# Patient Record
Sex: Male | Born: 1985 | Race: White | Hispanic: No | Smoking: Never smoker
Health system: Southern US, Community
[De-identification: ages and names within clinical notes are randomized; demographics above are authoritative.]

---

## 2006-08-26 ENCOUNTER — Ambulatory Visit: Payer: Self-pay | Admitting: Family Medicine

## 2006-08-26 DIAGNOSIS — R002 Palpitations: Secondary | ICD-10-CM | POA: Insufficient documentation

## 2006-08-26 DIAGNOSIS — R1012 Left upper quadrant pain: Secondary | ICD-10-CM

## 2006-11-16 ENCOUNTER — Emergency Department (HOSPITAL_COMMUNITY): Admission: EM | Admit: 2006-11-16 | Discharge: 2006-11-16 | Payer: Self-pay | Admitting: Emergency Medicine

## 2007-05-07 ENCOUNTER — Emergency Department (HOSPITAL_COMMUNITY): Admission: EM | Admit: 2007-05-07 | Discharge: 2007-05-08 | Payer: Self-pay | Admitting: Emergency Medicine

## 2010-10-13 LAB — D-DIMER, QUANTITATIVE: D-Dimer, Quant: 0.25

## 2010-10-13 LAB — BASIC METABOLIC PANEL
CO2: 26
Calcium: 9.7
Chloride: 108
Creatinine, Ser: 0.82
Glucose, Bld: 109 — ABNORMAL HIGH

## 2010-10-13 LAB — CBC
MCHC: 33.3
MCV: 92.1
RDW: 12.4

## 2010-10-28 LAB — DIFFERENTIAL
Basophils Absolute: 0
Eosinophils Relative: 0
Lymphocytes Relative: 9 — ABNORMAL LOW
Monocytes Absolute: 0.4
Monocytes Relative: 4
Neutro Abs: 8.4 — ABNORMAL HIGH

## 2010-10-28 LAB — BASIC METABOLIC PANEL
CO2: 25
Calcium: 9.6
GFR calc Af Amer: >60
GFR calc non Af Amer: >60
Glucose, Bld: 149 — ABNORMAL HIGH
Potassium: 3.5
Sodium: 142

## 2010-10-28 LAB — RAPID URINE DRUG SCREEN, HOSP PERFORMED
Amphetamines: NOT DETECTED
Barbiturates: NOT DETECTED
Benzodiazepines: NOT DETECTED

## 2010-10-28 LAB — CBC
HCT: 45.8
Hemoglobin: 16
RBC: 5.09
RDW: 12.8

## 2017-04-18 ENCOUNTER — Other Ambulatory Visit: Payer: Self-pay | Admitting: Family Medicine

## 2017-04-18 ENCOUNTER — Ambulatory Visit
Admission: RE | Admit: 2017-04-18 | Discharge: 2017-04-18 | Disposition: A | Discharge status: Home / Self Care | Payer: BC Managed Care – PPO | Source: Ambulatory Visit | Attending: Family Medicine | Admitting: Family Medicine

## 2017-04-18 DIAGNOSIS — K5900 Constipation, unspecified: Secondary | ICD-10-CM

## 2017-07-18 ENCOUNTER — Other Ambulatory Visit: Payer: Self-pay | Admitting: Gastroenterology

## 2017-07-18 DIAGNOSIS — R634 Abnormal weight loss: Secondary | ICD-10-CM

## 2017-07-18 DIAGNOSIS — R1031 Right lower quadrant pain: Secondary | ICD-10-CM

## 2017-07-18 DIAGNOSIS — R194 Change in bowel habit: Secondary | ICD-10-CM

## 2017-07-27 ENCOUNTER — Ambulatory Visit
Admission: RE | Admit: 2017-07-27 | Discharge: 2017-07-27 | Disposition: A | Discharge status: Home / Self Care | Payer: BC Managed Care – PPO | Source: Ambulatory Visit | Attending: Gastroenterology | Admitting: Gastroenterology

## 2017-07-27 DIAGNOSIS — R1031 Right lower quadrant pain: Secondary | ICD-10-CM

## 2017-07-27 DIAGNOSIS — R194 Change in bowel habit: Secondary | ICD-10-CM

## 2017-07-27 DIAGNOSIS — R634 Abnormal weight loss: Secondary | ICD-10-CM

## 2017-07-27 MED ORDER — IOPAMIDOL (ISOVUE-300) INJECTION 61%
100.0000 mL | Freq: Once | INTRAVENOUS | Status: AC | PRN
  Administered 2017-07-27: 100 mL via INTRAVENOUS

## 2017-08-07 ENCOUNTER — Emergency Department
Admission: EM | Admit: 2017-08-07 | Discharge: 2017-08-07 | Disposition: A | Discharge status: Home / Self Care | Payer: BC Managed Care – PPO | Attending: Emergency Medicine | Admitting: Emergency Medicine

## 2017-08-07 ENCOUNTER — Other Ambulatory Visit: Payer: Self-pay

## 2017-08-07 DIAGNOSIS — R1084 Generalized abdominal pain: Secondary | ICD-10-CM | POA: Diagnosis present

## 2017-08-07 DIAGNOSIS — R109 Unspecified abdominal pain: Secondary | ICD-10-CM

## 2017-08-07 LAB — LIPASE, BLOOD: Lipase: 29 U/L (ref 11–51)

## 2017-08-07 LAB — COMPREHENSIVE METABOLIC PANEL
ALBUMIN: 4.7 g/dL (ref 3.5–5.0)
ALT: 14 U/L (ref 0–44)
AST: 23 U/L (ref 15–41)
Alkaline Phosphatase: 47 U/L (ref 38–126)
Anion gap: 11 (ref 5–15)
BUN: 6 mg/dL (ref 6–20)
CALCIUM: 9.6 mg/dL (ref 8.9–10.3)
CHLORIDE: 108 mmol/L (ref 98–111)
CO2: 24 mmol/L (ref 22–32)
CREATININE: 1.2 mg/dL (ref 0.61–1.24)
GFR calc Af Amer: >60 mL/min (ref >60)
GFR calc non Af Amer: >60 mL/min (ref >60)
GLUCOSE: 125 mg/dL — AB (ref 70–99)
Potassium: 3.2 mmol/L — ABNORMAL LOW (ref 3.5–5.1)
SODIUM: 143 mmol/L (ref 135–145)
Total Bilirubin: 1.6 mg/dL — ABNORMAL HIGH (ref 0.3–1.2)
Total Protein: 7.5 g/dL (ref 6.5–8.1)

## 2017-08-07 LAB — CBC
HCT: 46.7 % (ref 40.0–52.0)
Hemoglobin: 16.3 g/dL (ref 13.0–18.0)
MCH: 31.5 pg (ref 26.0–34.0)
MCHC: 34.9 g/dL (ref 32.0–36.0)
MCV: 90.1 fL (ref 80.0–100.0)
Platelets: 200 10*3/uL (ref 150–440)
RBC: 5.19 MIL/uL (ref 4.40–5.90)
RDW: 13.3 % (ref 11.5–14.5)
WBC: 6.7 10*3/uL (ref 3.8–10.6)

## 2017-08-07 LAB — URINALYSIS, COMPLETE (UACMP) WITH MICROSCOPIC
BACTERIA UA: NONE SEEN
BILIRUBIN URINE: NEGATIVE
GLUCOSE, UA: NEGATIVE mg/dL
Hgb urine dipstick: NEGATIVE
KETONES UR: 5 mg/dL — AB
Leukocytes, UA: NEGATIVE
Nitrite: NEGATIVE
PH: 5 (ref 5.0–8.0)
PROTEIN: NEGATIVE mg/dL
Specific Gravity, Urine: 1.02 (ref 1.005–1.030)

## 2017-08-07 NOTE — ED Triage Notes (Signed)
Pt to ER via POV c/o fever of 13F last night and low temperature this AM of 75F. Pt reports recent hx of RLQ and tenderness to palpation. Pt alert and oriented X4, active, cooperative, pt in NAD. RR even and unlabored, color WNL.

## 2017-08-07 NOTE — ED Provider Notes (Addendum)
[facility] Emergency Department Provider Note ____________________________________________   First MD Initiated Contact with Patient 08/07/17 1150     (approximate)  I have reviewed the triage vital signs and the nursing notes.   HISTORY  Chief Complaint Fever and Abdominal Pain  HPI Dustin Curry is a 32 y.o. male with a history of palpitations and panic attack who is presenting to the emergency department 2 months of right-sided abdominal pain.  He states that the pain now feels like a sensitivity to the right side of his abdomen.  He has been eating and drinking normally.  Says that he has been having normal formed stools.  However, he did notice what could be a warm in his stool yesterday.  He should be a picture on his phone and a proximally 1 inch, clear and ribbonlike object that he noticed while wiping yesterday.  However, he states that he did not move.  He has not had any out of country travel.  No recent antibiotics.  Says that he has had an ultrasound of his right lower quadrant without any acute finding.  Then went to gastroenterology and was evaluated with a CAT scan which showed thickening of the appendix but what sounds like without inflammatory changes.  History reviewed. No pertinent past medical history.  Patient Active Problem List   Diagnosis Date Noted  . PALPITATIONS 08/26/2006  . LUQ PAIN 08/26/2006    History reviewed. No pertinent surgical history.  Prior to Admission medications   Not on File    Allergies Penicillins  No family history on file.  Social History Social History   Tobacco Use  . Smoking status: Never Smoker  Substance Use Topics  . Alcohol use: Not Currently  . Drug use: Not on file    Review of Systems  Constitutional: No fever/chills Eyes: No visual changes. ENT: No sore throat. Cardiovascular: Denies chest pain. Respiratory: Denies shortness of breath. Gastrointestinal: no nausea, no vomiting.   No diarrhea.  No constipation. Genitourinary: Negative for dysuria. Musculoskeletal: Negative for back pain. Skin: Negative for rash. Neurological: Negative for headaches, focal weakness or numbness.   ____________________________________________   PHYSICAL EXAM:  VITAL SIGNS: ED Triage Vitals  Enc Vitals Group     BP 08/07/17 1128 140/86     Pulse Rate 08/07/17 1128 (!) 134     Resp 08/07/17 1128 16     Temp 08/07/17 1128 98.3 F (36.8 C)     Temp Source 08/07/17 1128 Oral     SpO2 08/07/17 1128 97 %     Weight 08/07/17 1129 122 lb (55.3 kg)     Height 08/07/17 1129 5\' 5"  (1.651 m)     Head Circumference --      Peak Flow --      Pain Score 08/07/17 1129 3     Pain Loc --      Pain Edu? --      Excl. in GC? --     Constitutional: Alert and oriented. Well appearing and in no acute distress. Eyes: Conjunctivae are normal.  Head: Atraumatic. Nose: No congestion/rhinnorhea. Mouth/Throat: Mucous membranes are moist.  Neck: No stridor.   Cardiovascular: Normal rate, regular rhythm. Grossly normal heart sounds.   Heart rate of 77 while I am in the room. Respiratory: Normal respiratory effort.  No retractions. Lungs CTAB. Gastrointestinal: Soft and nontender. No distention. No CVA tenderness. Musculoskeletal: No lower extremity tenderness nor edema.  No joint effusions. Neurologic:  Normal speech and language.  No gross focal neurologic deficits are appreciated. Skin:  Skin is warm, dry and intact. No rash noted. Psychiatric: Mood and affect are normal. Speech and behavior are normal.  ____________________________________________   LABS (all labs ordered are listed, but only abnormal results are displayed)  Labs Reviewed  COMPREHENSIVE METABOLIC PANEL - Abnormal; Notable for the following components:      Result Value   Potassium 3.2 (*)    Glucose, Bld 125 (*)    Total Bilirubin 1.6 (*)    All other components within normal limits  URINALYSIS, COMPLETE (UACMP) WITH  MICROSCOPIC - Abnormal; Notable for the following components:   Color, Urine AMBER (*)    APPearance HAZY (*)    Ketones, ur 5 (*)    All other components within normal limits  LIPASE, BLOOD  CBC   ____________________________________________  EKG  ED ECG REPORT I, Arelia Longestavid M Schaevitz, the attending physician, personally viewed and interpreted this ECG.   Date: 08/07/2017  EKG Time: 1131  Rate: 136  Rhythm: sinus tachycardia  Axis: Normal  Intervals:Incomplete right bundle branch block  ST&T Change: No ST segment elevation or depression.  No abnormal T wave inversion.  ____________________________________________  RADIOLOGY   ____________________________________________   PROCEDURES  Procedure(s) performed:   Procedures  Critical Care performed:   ____________________________________________   INITIAL IMPRESSION / ASSESSMENT AND PLAN / ED COURSE  Pertinent labs & imaging results that were available during my care of the patient were reviewed by me and considered in my medical decision making (see chart for details).  Differential diagnosis includes, but is not limited to, acute appendicitis, renal colic, testicular torsion, urinary tract infection/pyelonephritis, prostatitis,  epididymitis, diverticulitis, small bowel obstruction or ileus, colitis, abdominal aortic aneurysm, gastroenteritis, hernia, etc. As part of my medical decision making, I reviewed the following data within the electronic MEDICAL RECORD NUMBER Old chart reviewed and outpt ultrasound  ----------------------------------------- 1:46 PM on 08/07/2017 -----------------------------------------  Patient at this time continues with heart rate in the 70s and 80s.  Very reassuring lab work.  No further complaints of any worsening pain.  Patient will continue to observe his stool for any evidence of further strange appearing object such as the possible worm.  He does not have any high risk features to have  intestinal parasites.  He has a reassuring exam as well as lab work.  We will continue to observe.  He says that he has an appointment with a surgeon on the 29th.  We discussed the results as well as the treatment plan and he is understanding and willing to comply. ____________________________________________   FINAL CLINICAL IMPRESSION(S) / ED DIAGNOSES  Abdominal pain.  NEW MEDICATIONS STARTED DURING THIS VISIT:  New Prescriptions   No medications on file     Note:  This document was prepared using Dragon voice recognition software and may include unintentional dictation errors.     Myrna BlazerSchaevitz, David Matthew, MD 08/07/17 1347    Pershing ProudSchaevitz, Myra Rudeavid Matthew, MD 08/07/17 1350

## 2017-08-07 NOTE — ED Notes (Addendum)
Pt reports that 2 months ago he went to PCP for pain in right upper abd - he reports that "nothing" was done and he returned to PCP with same pain 1 month later - they obtained a CT scan and told him that his appendix was "thickened" but that they were not concerned - he states they also found "a 4mm lesion" on his liver - he feels like today that he has 3 masses that he can feel "through the skin" on his right upper abd that is causing him pain - he reports "fever of 99.0" last night - he also reports hx of anxiety that causes his heart rate to be elevated

## 2018-02-15 ENCOUNTER — Other Ambulatory Visit: Payer: Self-pay | Admitting: Physician Assistant

## 2018-02-15 DIAGNOSIS — R1011 Right upper quadrant pain: Secondary | ICD-10-CM

## 2018-02-15 DIAGNOSIS — R1012 Left upper quadrant pain: Secondary | ICD-10-CM

## 2018-02-15 DIAGNOSIS — R112 Nausea with vomiting, unspecified: Secondary | ICD-10-CM

## 2018-02-17 ENCOUNTER — Ambulatory Visit
Admission: RE | Admit: 2018-02-17 | Discharge: 2018-02-17 | Disposition: A | Discharge status: Home / Self Care | Payer: BC Managed Care – PPO | Source: Ambulatory Visit | Attending: Physician Assistant | Admitting: Physician Assistant

## 2018-02-17 DIAGNOSIS — R1011 Right upper quadrant pain: Secondary | ICD-10-CM

## 2018-02-17 DIAGNOSIS — R112 Nausea with vomiting, unspecified: Secondary | ICD-10-CM

## 2018-02-17 DIAGNOSIS — R1012 Left upper quadrant pain: Secondary | ICD-10-CM

## 2018-07-18 ENCOUNTER — Other Ambulatory Visit: Payer: Self-pay

## 2018-07-18 ENCOUNTER — Ambulatory Visit: Payer: BC Managed Care – PPO | Admitting: Sports Medicine

## 2018-07-18 ENCOUNTER — Ambulatory Visit: Payer: Self-pay

## 2018-07-18 ENCOUNTER — Encounter: Payer: Self-pay | Admitting: Sports Medicine

## 2018-07-18 VITALS — BP 96/58 | Ht 66.0 in | Wt 123.0 lb

## 2018-07-18 DIAGNOSIS — M25562 Pain in left knee: Secondary | ICD-10-CM

## 2018-07-18 DIAGNOSIS — M222X2 Patellofemoral disorders, left knee: Secondary | ICD-10-CM | POA: Diagnosis not present

## 2018-07-18 NOTE — Assessment & Plan Note (Signed)
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  He also has VMO weakness on the left compared to the right side and compared to the vastus lateralis on the left side.  He was given VMO strengthening exercises including straight leg raises with squeezing an object between his knees to activate the VMO, straight leg raises with the toe pointed outward, wall squats using the left leg, and squats on the left leg.  We will follow-up with him in about 6 weeks to check his improvement.

## 2018-07-18 NOTE — Progress Notes (Signed)
Subjective:    Dustin Curry - 33 y.o. male MRN [id number]  Date of birth: 02-05-1985  CC:  Dustin Curry is here for chronic left knee pain.  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  He has continued to stay active with hiking and has done some running, but his left knee pain has prohibited him from running much at all times and hiking downhill is very painful for him.  He also says that climbing stairs exacerbates his pain.  Does not know of any alleviating factors.  ROS  He has never had any swelling, popping, clicking, or locking of the knee and denies numbness or tingling distal to the knee.  Health Maintenance:  Health Maintenance Due  Topic Date Due  . HIV Screening  02/01/1999  . TETANUS/TDAP  02/01/2003    -  reports that he has never smoked. He does not have any smokeless tobacco history on file. - Review of Systems: Per HPI. - Past Medical History: Patient Active Problem List   Diagnosis Date Noted  . Patellofemoral disorder of left knee 07/18/2018  . PALPITATIONS 08/26/2006  . LUQ PAIN 08/26/2006   - Medications: reviewed and updated   Objective:   Physical Exam BP (!) 96/58   Ht 5\' 6"  (1.676 m)   Wt 123 lb (55.8 kg)   BMI 19.85 kg/m  Gen: NAD, alert, cooperative with exam, well-appearing Knee, left: Inspection was negative for erythema, negative for effusion, and negative for obvious bony abnormalities. Palpation was negative for obvious Baker's cyst development, negative for asymmetric warmth, negative for joint line tenderness, positive for lateral condyle tenderness, negative for patellar tenderness, positive for patellar crepitus, and negative for tenderness of the pes anserine bursa. Patellar and quadriceps tendons were unremarkable. ROM normal in flexion (135 degrees) and extension (0 degrees) and lower leg rotation. Normal hamstring and quadriceps strength.  VMO  appears atrophied on medial side of the knee compared to the right.  There appears to be some laxity of the MCL.  Neurovascularly intact.  - Additional tests performed:    - Anterior Drawer neg   - Posterior Drawer neg   - L leg squat reproduces pain  - Meniscus:   - Thessaly: negative   - McMurray's: negative   Knee, right: Inspection was negative for erythema, negative for effusion, and negative for obvious bony abnormalities. Palpation was negative for obvious Baker's cyst development, negative for asymmetric warmth, negative for joint line tenderness, negative for condyle tenderness, negative for patellar tenderness, negative for patellar crepitus, and negative for tenderness of the pes anserine bursa. Patellar and quadriceps tendons were unremarkable. ROM normal in flexion (135 degrees) and extension (0 degrees) and lower leg rotation. Normal hamstring and quadriceps strength. Neurovascularly intact.  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  He also has VMO weakness on the left compared to the right side and compared to the vastus lateralis on the left side.  He was given VMO strengthening exercises including straight leg raises with squeezing an object between his knees  to activate the VMO, straight leg raises with the toe pointed outward, wall squats using the left leg, and squats on the left leg.  We will follow-up with him in about 6 weeks to check his improvement.    Lezlie OctaveAmanda Avyn Coate, M.D. 07/18/2018, 11:35 AM PGY-2, [facility]  I observed and examined the patient with the resident and agree with assessment and plan.  Note reviewed and modified by me. Sterling BigKB Fields,  MD

## 2018-11-11 ENCOUNTER — Encounter (INDEPENDENT_AMBULATORY_CARE_PROVIDER_SITE_OTHER): Payer: Self-pay

## 2018-11-17 IMAGING — CT CT ABD-PELV W/ CM
1 of 2 series · 14 of 32 positions shown, 19 images · IV contrast (APPLIED)
Comparison: None.

CLINICAL DATA: Right lower quadrant pain and weight loss of 8
pounds in the past 3-4 weeks. Change in bowel habits.

EXAM:
CT ABDOMEN AND PELVIS WITH CONTRAST
TECHNIQUE: Multidetector CT imaging of the abdomen and pelvis was performed
using the standard protocol following bolus administration of
intravenous contrast.
CONTRAST:  100mL 3TML6B-FAA IOPAMIDOL (3TML6B-FAA) INJECTION 61%

[Series 2: abd/pelvis w/cm · axial · 0.65mm/px · z∈[-467,-117]mm · 14 of 80 slices shown, 19 images]
[im 5/80  soft-tissue]
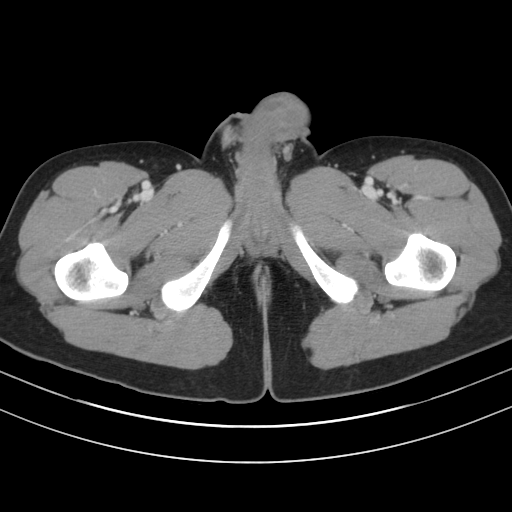
[im 5/80  bone]
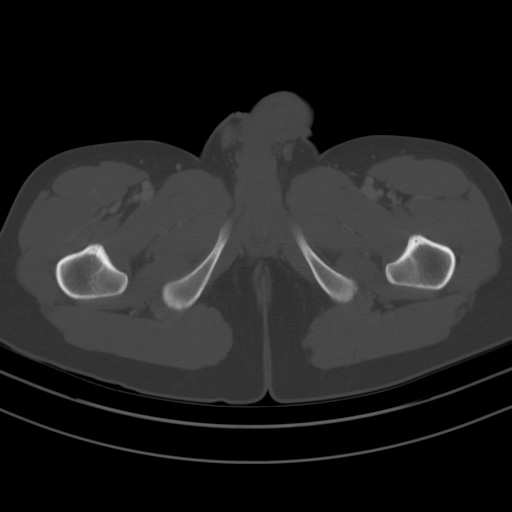
[im 13/80  soft-tissue]
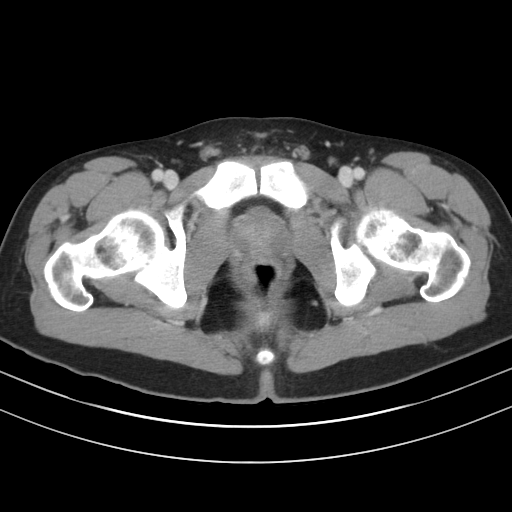
[im 17/80  soft-tissue]
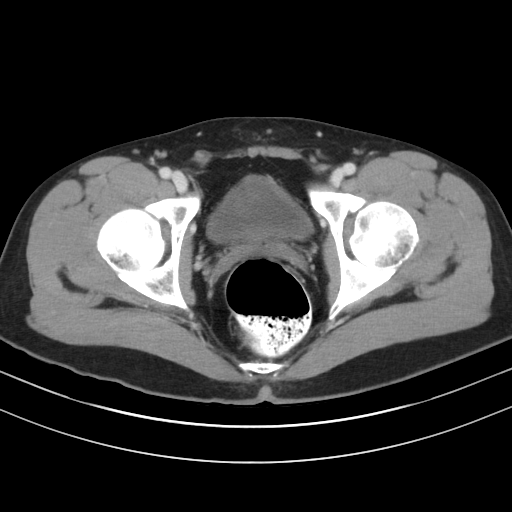
[im 21/80  soft-tissue]
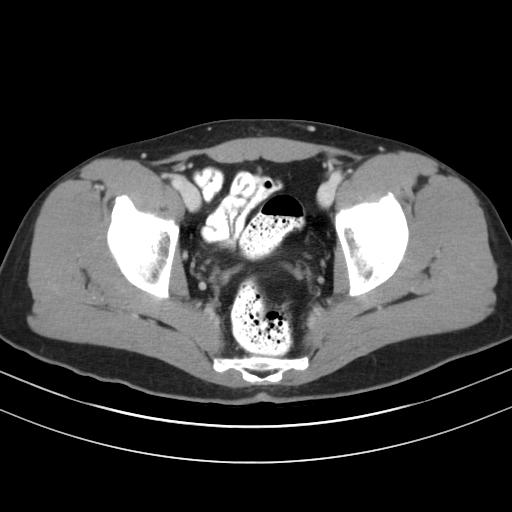
[im 30/80  soft-tissue]
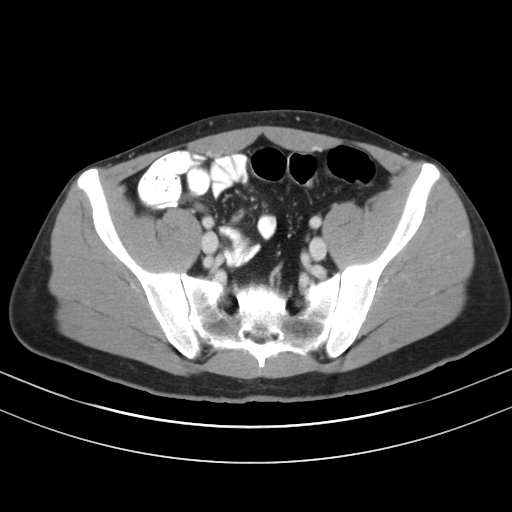
[im 34/80  soft-tissue]
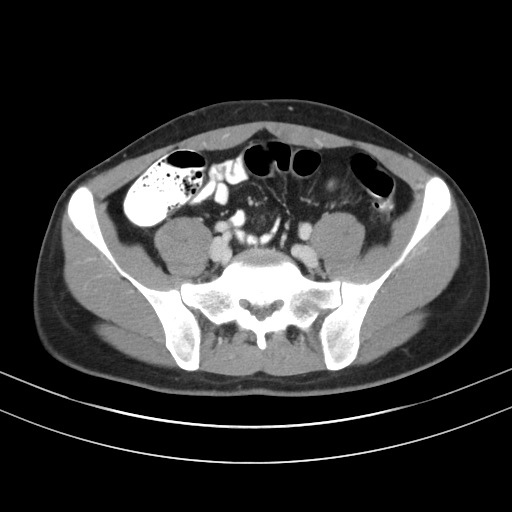
[im 42/80  soft-tissue]
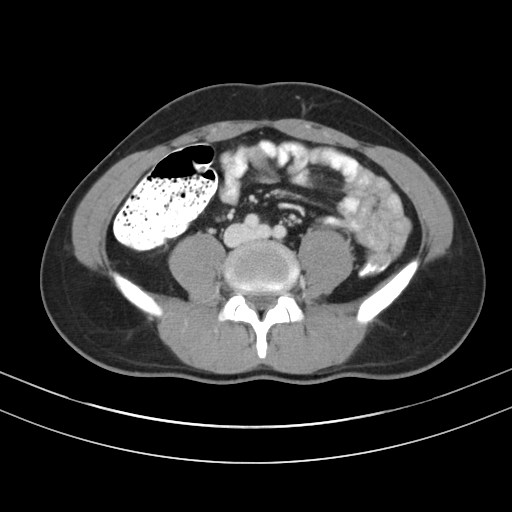
[im 46/80  soft-tissue]
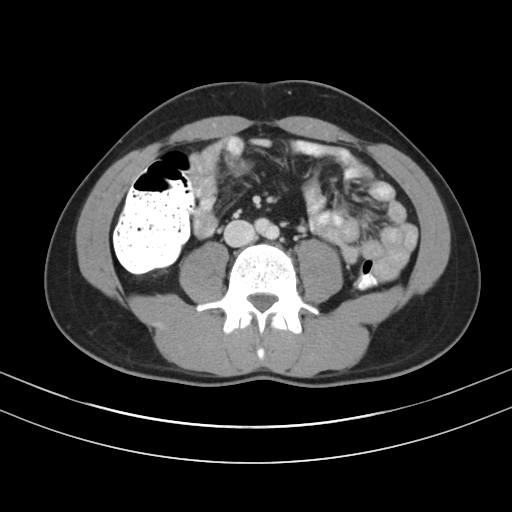
[im 50/80  soft-tissue]
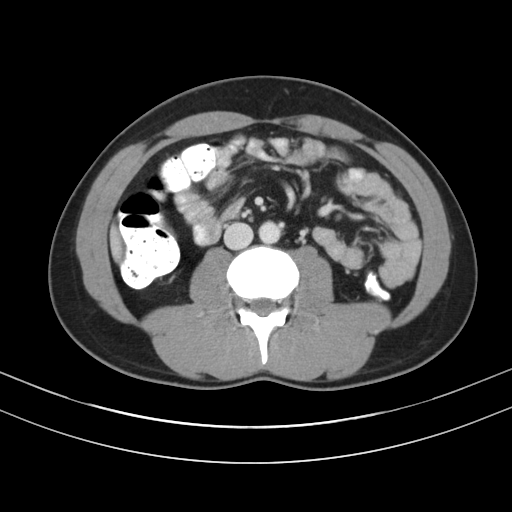
[im 50/80  bone]
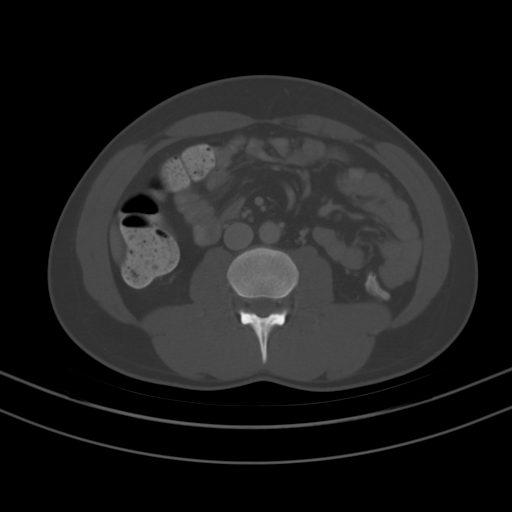
[im 59/80  soft-tissue]
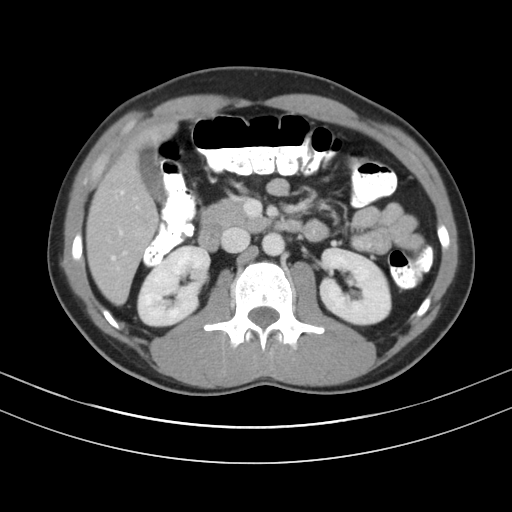
[im 63/80  soft-tissue]
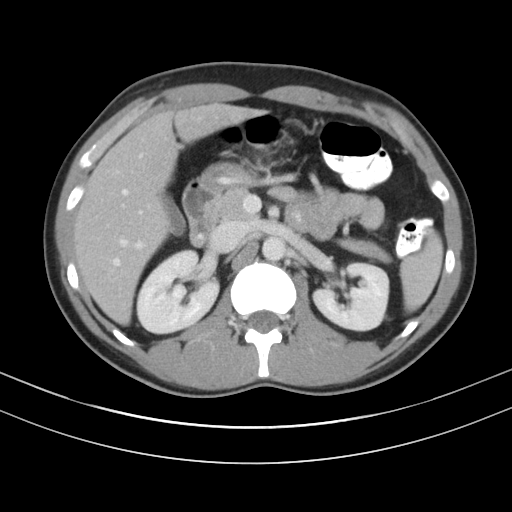
[im 63/80  lung]
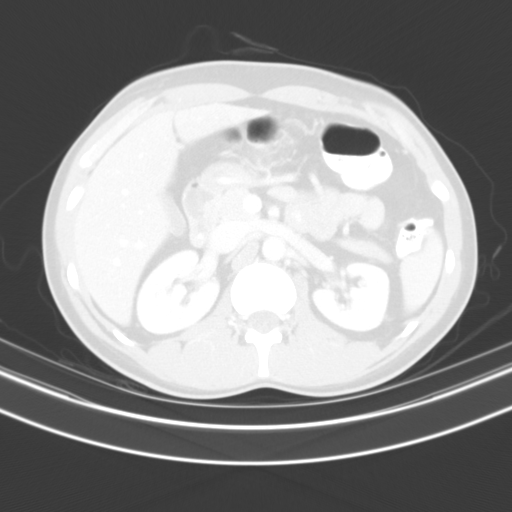
[im 67/80  soft-tissue]
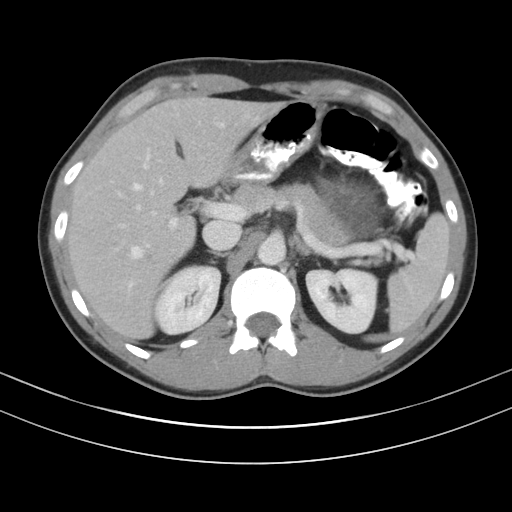
[im 67/80  lung]
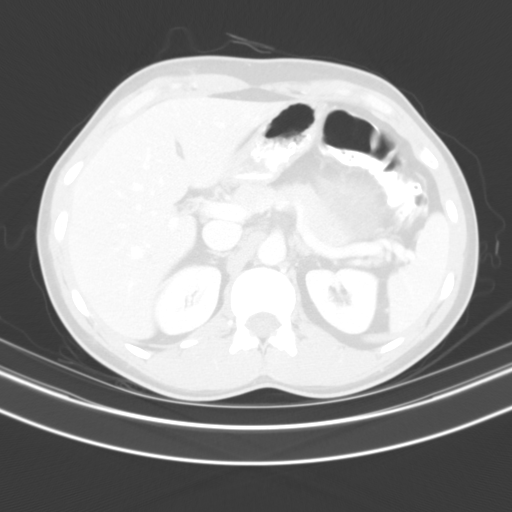
[im 71/80  lung]
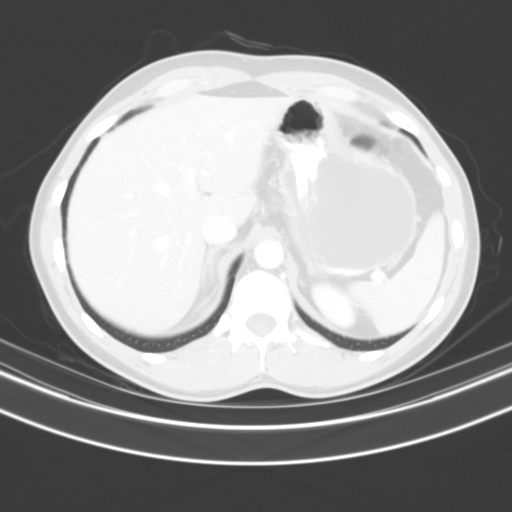
[im 75/80  soft-tissue]
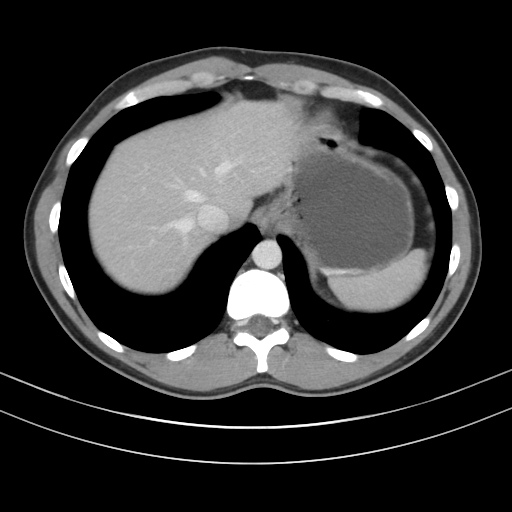
[im 75/80  lung]
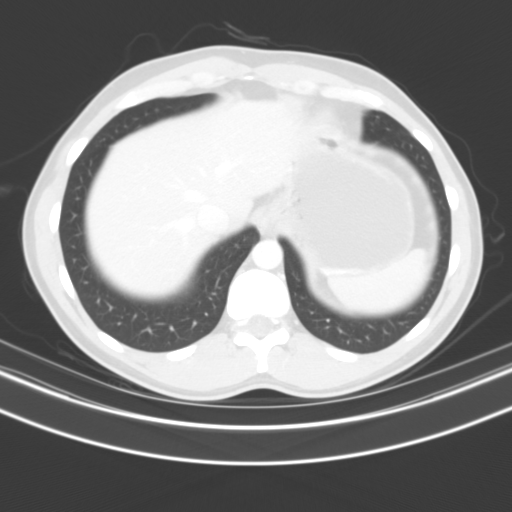

[14 of 32 positions shown; findings below may reference images not displayed]

FINDINGS: Lower chest: Clear lung bases. Normal heart size without pericardial
or pleural effusion.

Hepatobiliary: Too small to characterize right hepatic lobe 4 mm
lesion. Normal gallbladder, without biliary ductal dilatation.

Pancreas: Normal, without mass or ductal dilatation.

Spleen: Normal in size, without focal abnormality.

Adrenals/Urinary Tract: Normal adrenal glands. Normal kidneys,
without hydronephrosis. Normal urinary bladder.

Stomach/Bowel: Normal stomach, without wall thickening. Normal colon
and terminal ileum. The appendix is normal in caliber, including on
image 50/2. Subtle periappendiceal interstitial thickening is
suspected, including on coronal images 37 and 39. Also transverse
image 49/2, sagittal image 48.

Normal small bowel.

Vascular/Lymphatic: Normal caliber of the aorta and branch vessels.
No abdominopelvic adenopathy.

Reproductive: Normal prostate.

Other: No significant free fluid.

Musculoskeletal: Disc bulges at L4-5 and L5-S1.
IMPRESSION: 1. Normal caliber appendix with suggestion of subtle periappendiceal
interstitial thickening. In this patient with reported clinical
symptoms of 3-4 weeks, this is indeterminate and may be within
normal variation. If the patient has superimposed more
acute/subacute symptoms, early or mild appendicitis cannot be
excluded.
2. No other explanation for patient's symptoms.

These results will be called to the ordering clinician or
representative by the Radiologist Assistant, and communication
documented in the PACS or zVision Dashboard.

## 2019-01-05 ENCOUNTER — Ambulatory Visit: Payer: BC Managed Care – PPO | Attending: Internal Medicine

## 2019-01-05 DIAGNOSIS — Z20822 Contact with and (suspected) exposure to covid-19: Secondary | ICD-10-CM

## 2019-01-06 LAB — NOVEL CORONAVIRUS, NAA: SARS-CoV-2, NAA: NOT DETECTED

## 2019-01-25 ENCOUNTER — Ambulatory Visit: Payer: BC Managed Care – PPO | Attending: Internal Medicine

## 2019-01-25 DIAGNOSIS — Z20822 Contact with and (suspected) exposure to covid-19: Secondary | ICD-10-CM

## 2019-01-27 LAB — NOVEL CORONAVIRUS, NAA: SARS-CoV-2, NAA: NOT DETECTED

## 2019-02-28 ENCOUNTER — Ambulatory Visit: Payer: BC Managed Care – PPO | Attending: Internal Medicine

## 2019-02-28 DIAGNOSIS — Z20822 Contact with and (suspected) exposure to covid-19: Secondary | ICD-10-CM

## 2019-03-01 LAB — NOVEL CORONAVIRUS, NAA: SARS-CoV-2, NAA: NOT DETECTED

## 2019-03-05 ENCOUNTER — Ambulatory Visit: Payer: BC Managed Care – PPO | Attending: Internal Medicine

## 2019-03-05 DIAGNOSIS — Z20822 Contact with and (suspected) exposure to covid-19: Secondary | ICD-10-CM

## 2019-03-06 LAB — NOVEL CORONAVIRUS, NAA: SARS-CoV-2, NAA: NOT DETECTED

## 2019-12-21 ENCOUNTER — Other Ambulatory Visit: Payer: BC Managed Care – PPO

## 2019-12-21 DIAGNOSIS — Z20822 Contact with and (suspected) exposure to covid-19: Secondary | ICD-10-CM

## 2019-12-23 LAB — SARS-COV-2, NAA 2 DAY TAT

## 2019-12-23 LAB — NOVEL CORONAVIRUS, NAA: SARS-CoV-2, NAA: NOT DETECTED

## 2020-03-11 ENCOUNTER — Other Ambulatory Visit: Payer: Self-pay

## 2020-03-11 ENCOUNTER — Ambulatory Visit
Admission: RE | Admit: 2020-03-11 | Discharge: 2020-03-11 | Disposition: A | Discharge status: Home / Self Care | Payer: BC Managed Care – PPO | Source: Ambulatory Visit | Attending: Family Medicine | Admitting: Family Medicine

## 2020-03-11 ENCOUNTER — Other Ambulatory Visit: Payer: Self-pay | Admitting: Family Medicine

## 2020-03-11 DIAGNOSIS — M25512 Pain in left shoulder: Secondary | ICD-10-CM

## 2021-07-02 IMAGING — DX DG SHOULDER 2+V*L*
3 series · 3 of 3 positions shown · non-contrast
Comparison: None.

CLINICAL DATA: Pain

EXAM:
LEFT SHOULDER - 2+ VIEW

[dg shoulder left (1 of 3)]
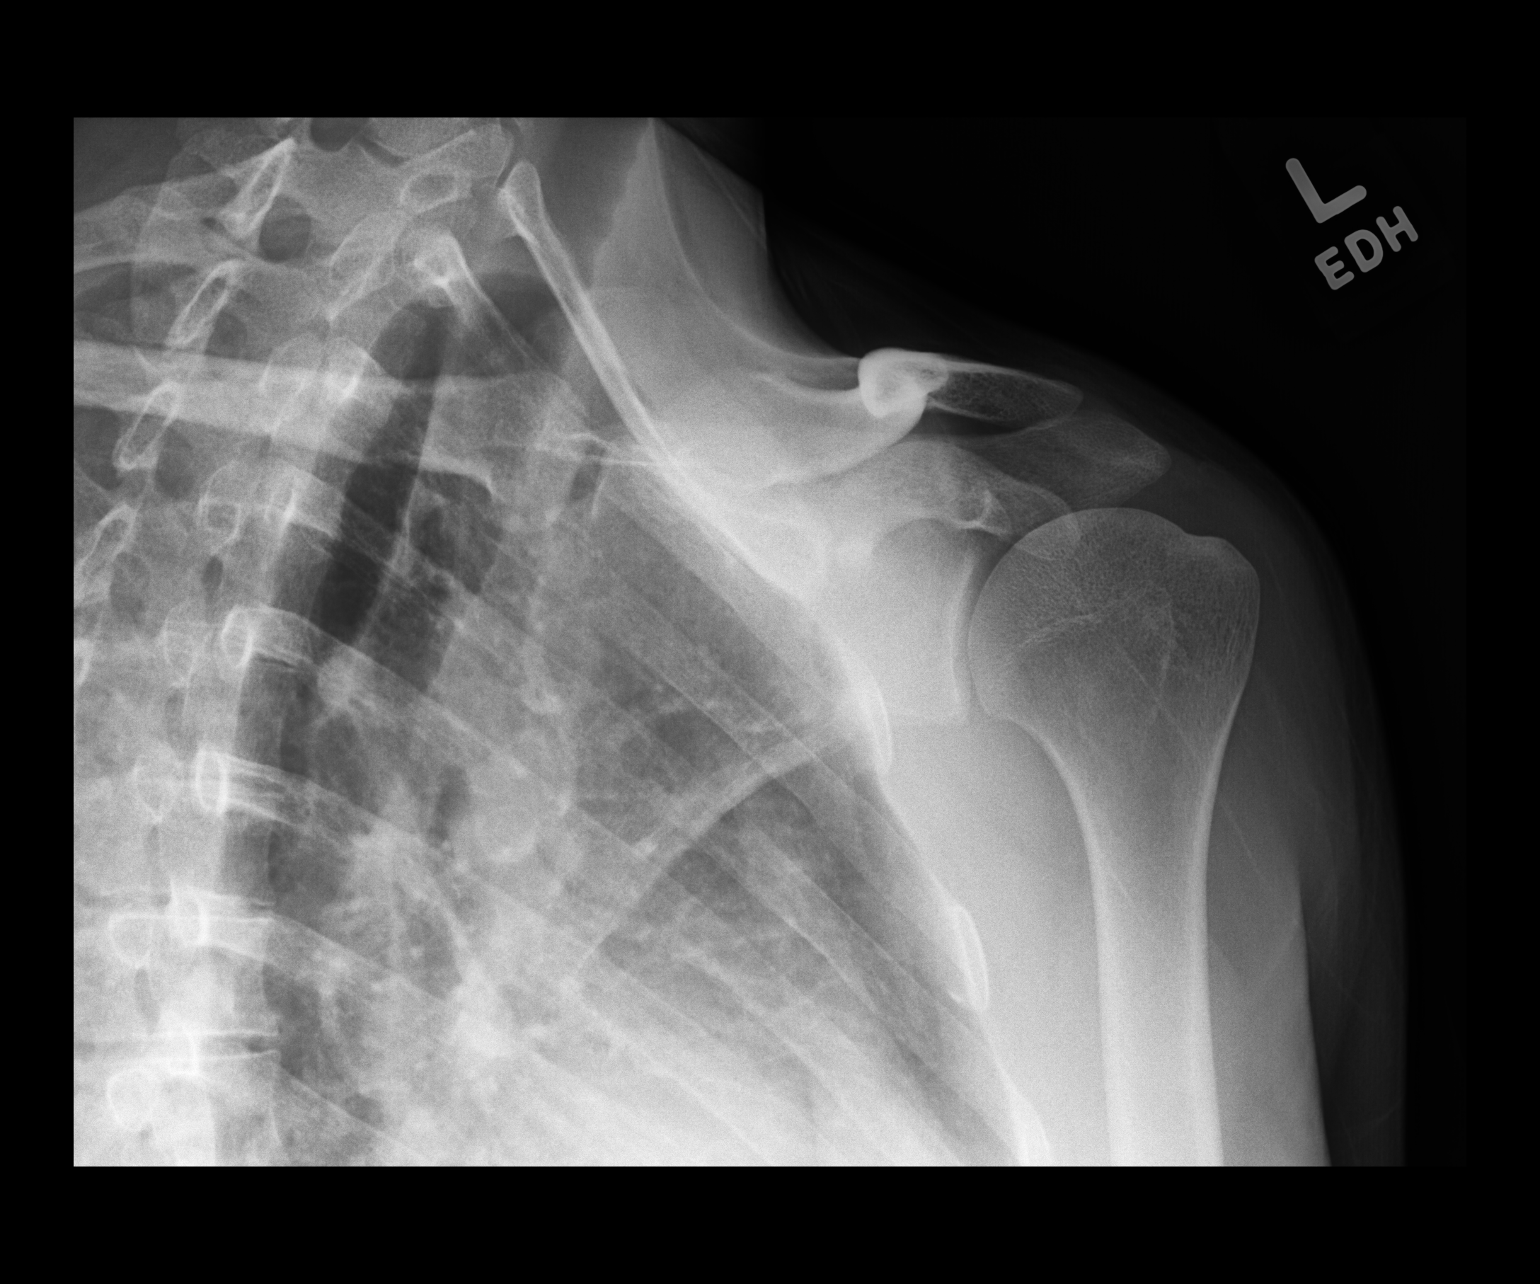

[dg shoulder left (2 of 3)]
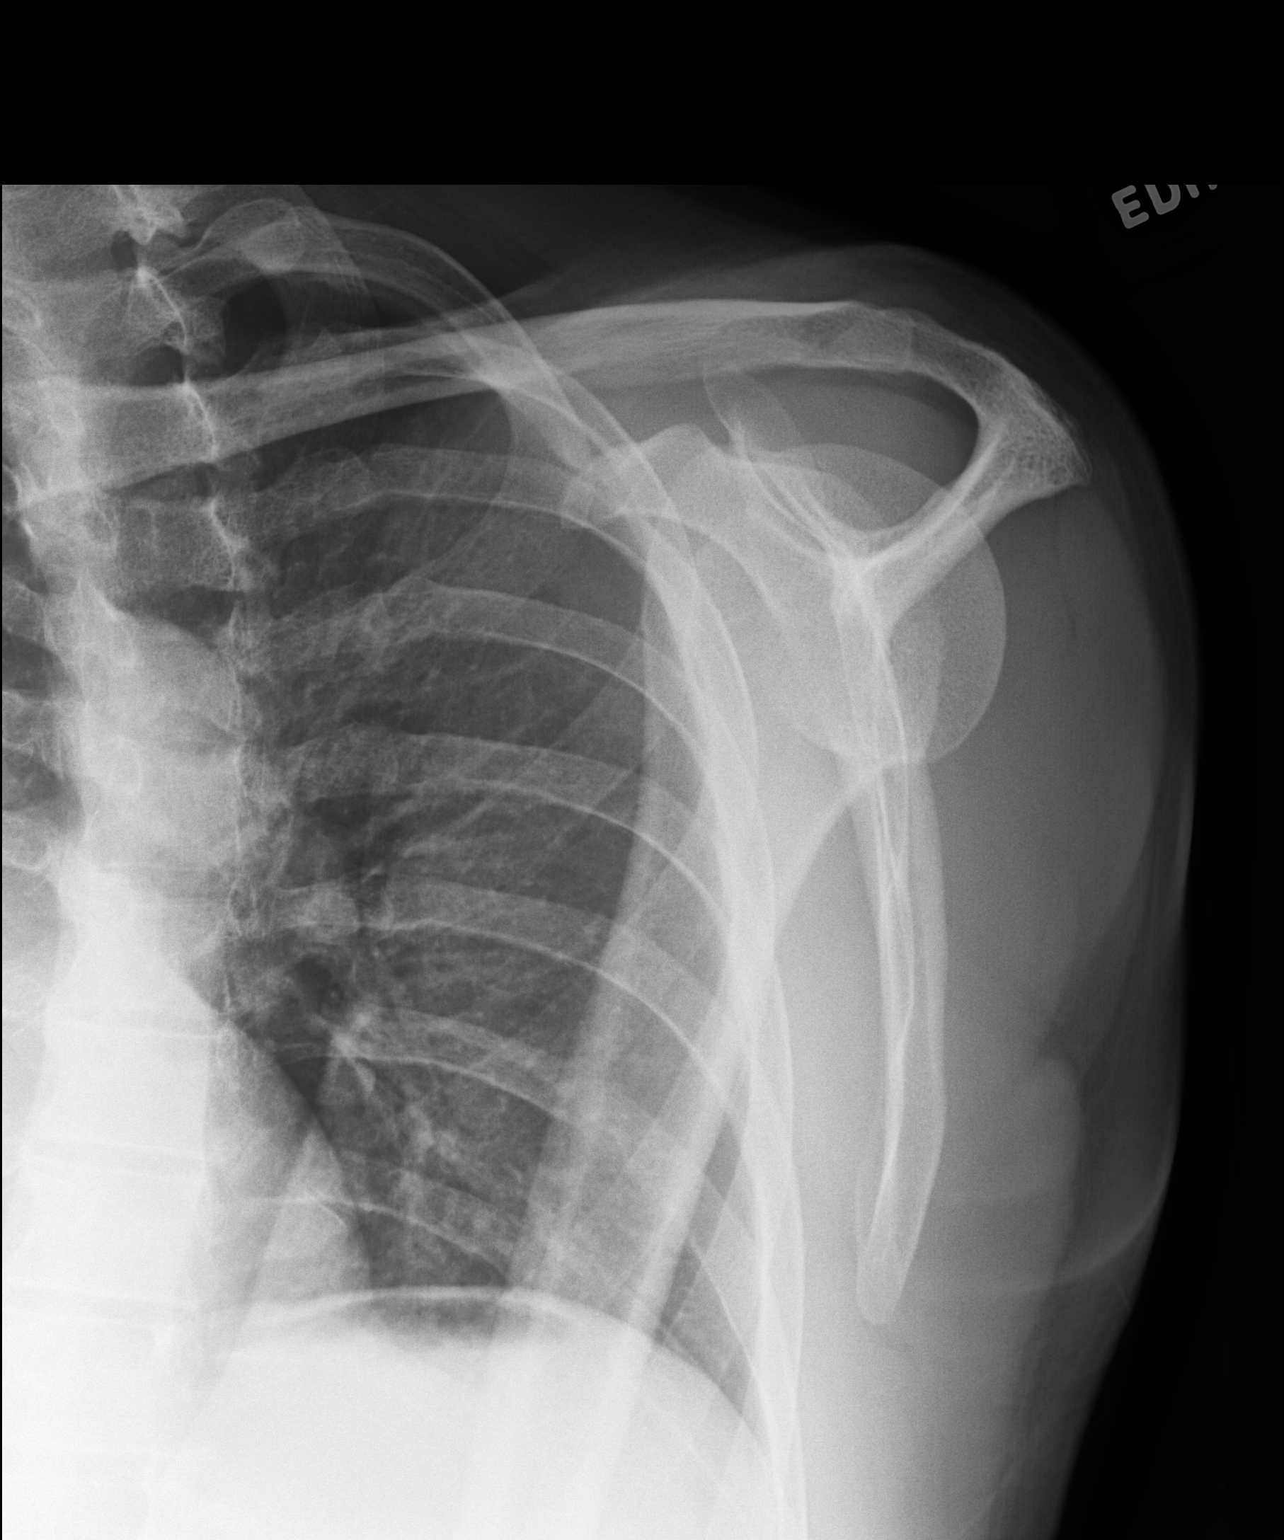

[dg shoulder left (3 of 3)]
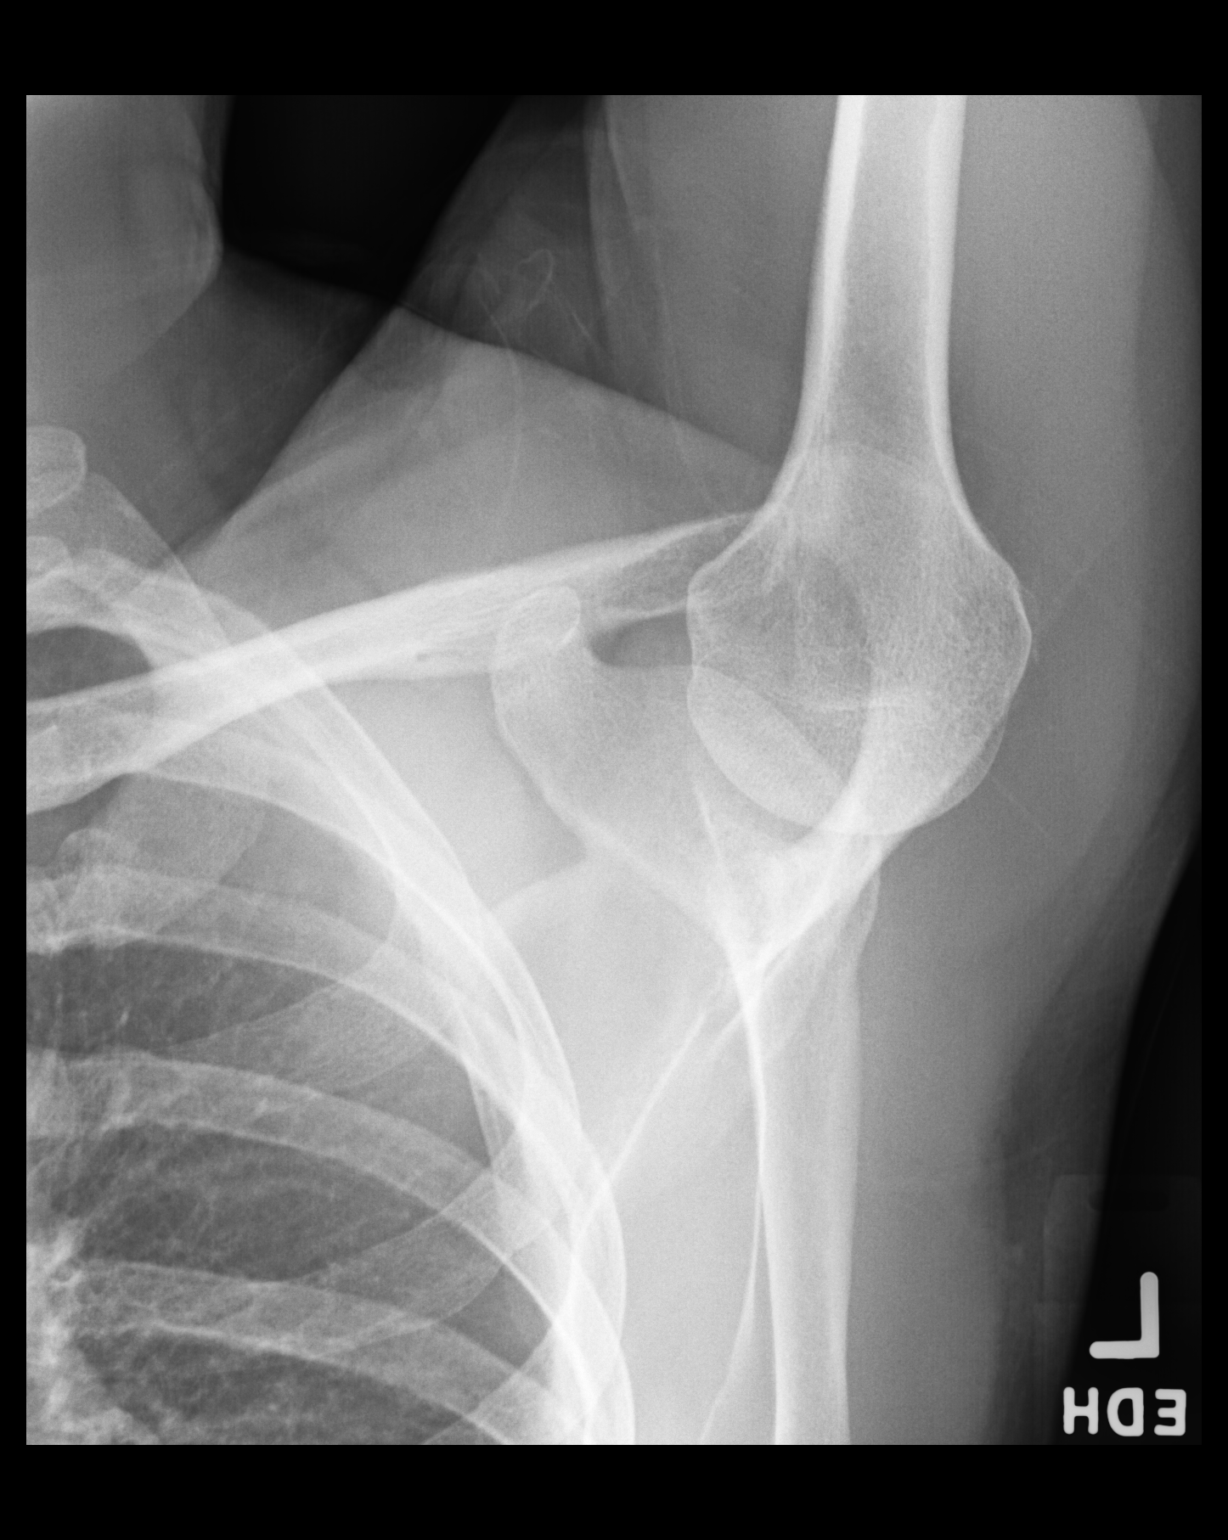

[3 of 3 positions shown; findings below may reference images not displayed]

FINDINGS: No acute fracture or dislocation. Joint spaces and alignment are
maintained. No area of erosion or osseous destruction. No unexpected
radiopaque foreign body. Soft tissues are unremarkable.
IMPRESSION: No acute fracture or dislocation.

## 2022-08-12 ENCOUNTER — Ambulatory Visit: Payer: BC Managed Care – PPO

## 2022-08-12 ENCOUNTER — Ambulatory Visit: Payer: BC Managed Care – PPO | Admitting: Podiatry

## 2022-08-12 DIAGNOSIS — M79671 Pain in right foot: Secondary | ICD-10-CM

## 2022-08-12 DIAGNOSIS — Q828 Other specified congenital malformations of skin: Secondary | ICD-10-CM | POA: Diagnosis not present

## 2022-08-12 DIAGNOSIS — M722 Plantar fascial fibromatosis: Secondary | ICD-10-CM

## 2022-08-12 NOTE — Progress Notes (Signed)
      Chief Complaint  Patient presents with   Foot Pain    Patient came in today for right foot pain, in the forefoot, started a year 1/2 ago, rate of pain 4 out of 10 when pressing on it,  patient also has a small knot on the left arch, X-Rays done today    HPI: 37 y.o. male presenting today with c/o pain in the plantar aspect of the right foot.  Denies trauma or bruising.  He also has a hard skin lesion on the plantar aspect of the left midfoot.  No past medical history on file.  No past surgical history on file.  Allergies  Allergen Reactions   Penicillins     Physical Exam: General: The patient is alert and oriented x3 in no acute distress.  Dermatology:  No ecchymosis, erythema, or edema bilateral.  No open lesions.  There is a very small focal hyperkeratotic lesion on the plantar aspect of the left midfoot.  No surrounding erythema or evidence of foreign body are noted.  Vascular: Palpable pedal pulses bilaterally. Capillary refill within normal limits.  No appreciable edema.    Neurological: Light touch sensation intact bilateral.  MMT 5/5 to lower extremity bilateral. Negative Tinel's sign with percussion of the posterior tibial nerve on the affected extremity.    Musculoskeletal Exam:  There is pain on palpation plantar aspect of the forefoot near the metatarsal phalangeal joint but proximal to the metatarsal heads mostly along the central plantar fascial band.  No gaps or nodules within the plantar fascia.  Positive Windlass mechanism bilateral.  Antalgic gait noted with first few steps upon standing.  No pain on palpation of achilles tendon bilateral.  Ankle df normal with use knee extended b/l.  Radiographic Exam (right foot, 3 weightbearing views, 08/12/2022):  Normal osseous mineralization.  There is joint space narrowing at the first metatarsophalangeal joint.  There is lateral angulation of the distal phalanx of the second toe at the level of the DIPJ.  No fracture or  calcaneal spur are noted.  Assessment/Plan of Care: 1. Plantar fascial fibromatosis   2. Right foot pain   3. Porokeratosis     Reviewed etiology of plantar fasciitis with patient.  Discussed treatment options with patient today, including cortisone injection, NSAID course of treatment, stretching exercises, physical therapy, use of night splint, rest, icing the heel, arch supports/orthotics, and supportive shoe gear.    Patient was fitted for power step arch supports.  This should help alleviate pulling and pressure proximal to the metatarsal heads and support the central pain and of the plantar fascial better.  The hyperkeratotic lesion on the plantar aspect of the left midfoot was shaved uneventfully.  Was not able to locate the Cantharone solution or the Salinocaine ointment in the exam room so patient was advised to use Kerasal cream or/and use pumice stone on the area to try to prevent recurrence.  Return if symptoms worsen or fail to improve.   Clerance Lav, DPM, FACFAS [facility]     [address] [place], [place] [address]                Office [phone]  Fax [phone]

## 2022-08-15 NOTE — Patient Instructions (Signed)
